# Patient Record
Sex: Female | Born: 2005 | Race: White | Hispanic: No | Marital: Single | Smoking: Never smoker
Health system: Southern US, Community
[De-identification: ages and names within clinical notes are randomized; demographics above are authoritative.]

## PROBLEM LIST (undated history)

## (undated) DIAGNOSIS — R569 Unspecified convulsions: Secondary | ICD-10-CM

## (undated) DIAGNOSIS — G809 Cerebral palsy, unspecified: Secondary | ICD-10-CM

## (undated) DIAGNOSIS — L509 Urticaria, unspecified: Secondary | ICD-10-CM

## (undated) DIAGNOSIS — Q928 Other specified trisomies and partial trisomies of autosomes: Secondary | ICD-10-CM

## (undated) DIAGNOSIS — D649 Anemia, unspecified: Secondary | ICD-10-CM

## (undated) DIAGNOSIS — K219 Gastro-esophageal reflux disease without esophagitis: Secondary | ICD-10-CM

## (undated) DIAGNOSIS — M810 Age-related osteoporosis without current pathological fracture: Secondary | ICD-10-CM

## (undated) HISTORY — DX: Unspecified convulsions: R56.9

## (undated) HISTORY — PX: EYE SURGERY: SHX253

## (undated) HISTORY — PX: NISSEN FUNDOPLICATION: SHX2091

## (undated) HISTORY — PX: HIP SURGERY: SHX245

## (undated) HISTORY — DX: Anemia, unspecified: D64.9

## (undated) HISTORY — PX: TYMPANOTOMY: SHX2588

## (undated) HISTORY — PX: GASTROSTOMY TUBE PLACEMENT: SHX655

## (undated) HISTORY — DX: Urticaria, unspecified: L50.9

---
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
## 2024-01-03 ENCOUNTER — Encounter: Payer: Self-pay | Admitting: Pediatrics

## 2024-01-03 DIAGNOSIS — G800 Spastic quadriplegic cerebral palsy: Secondary | ICD-10-CM

## 2024-01-03 DIAGNOSIS — G40814 Lennox-Gastaut syndrome, intractable, without status epilepticus: Secondary | ICD-10-CM

## 2024-01-03 DIAGNOSIS — R1312 Dysphagia, oropharyngeal phase: Secondary | ICD-10-CM

## 2024-01-04 ENCOUNTER — Telehealth: Payer: Self-pay | Admitting: Pediatrics

## 2024-01-04 NOTE — Telephone Encounter (Signed)
 Fax sent to [facility] for adequate quantity for daily use of real foods blend in variety to manage her nutritional daily requirements  Received SUCCESS, placed in dated 3 folder

## 2024-01-16 ENCOUNTER — Inpatient Hospital Stay (HOSPITAL_COMMUNITY): Admission: RE | Admit: 2024-01-16 | Source: Ambulatory Visit

## 2024-01-16 ENCOUNTER — Other Ambulatory Visit (HOSPITAL_COMMUNITY): Payer: Self-pay | Admitting: Pediatric Nephrology

## 2024-01-16 DIAGNOSIS — I150 Renovascular hypertension: Secondary | ICD-10-CM

## 2024-01-17 ENCOUNTER — Encounter: Attending: Physical Medicine & Rehabilitation | Admitting: Physical Medicine & Rehabilitation

## 2024-01-17 ENCOUNTER — Ambulatory Visit (HOSPITAL_COMMUNITY)
Admission: RE | Admit: 2024-01-17 | Discharge: 2024-01-17 | Disposition: A | Discharge status: Home / Self Care | Source: Ambulatory Visit | Attending: Pediatric Nephrology | Admitting: Pediatric Nephrology

## 2024-01-17 ENCOUNTER — Encounter: Payer: Self-pay | Admitting: Physical Medicine & Rehabilitation

## 2024-01-17 VITALS — BP 128/88 | HR 89

## 2024-01-17 DIAGNOSIS — G800 Spastic quadriplegic cerebral palsy: Secondary | ICD-10-CM | POA: Diagnosis present

## 2024-01-17 DIAGNOSIS — I150 Renovascular hypertension: Secondary | ICD-10-CM | POA: Diagnosis not present

## 2024-01-17 MED ORDER — ONABOTULINUMTOXINA 100 UNITS IJ SOLR
400.0000 [IU] | Freq: Once | INTRAMUSCULAR | Status: AC
  Administered 2024-01-17: 400 [IU] via INTRAMUSCULAR

## 2024-01-17 MED ORDER — SODIUM CHLORIDE (PF) 0.9 % IJ SOLN
8.0000 mL | Freq: Once | INTRAMUSCULAR | Status: AC
  Administered 2024-01-17: 8 mL via INTRAVENOUS

## 2024-01-17 NOTE — Patient Instructions (Signed)

## 2024-01-17 NOTE — Progress Notes (Signed)
 Botox  Injection for spasticity using needle EMG guidance  Dilution: 50 Units/ml Indication: Severe spasticity which interferes with ADL,mobility and/or  hygiene and is unresponsive to medication management and other conservative care Informed consent was obtained after describing risks and benefits of the procedure with the patient. This includes bleeding, bruising, infection, excessive weakness, or medication side effects. A REMS form is on file and signed. Chlorhexidine prep Needle: 27g 1 needle electrode Number of units per muscle RIght  FCR 50 FCU 25 PL 25 OP25 Biceps 50 Brachiorad 25  Left  FCR 50 FCU 25 PL 25 OP 25  Biceps 50 Brachiorad 25  All injections were done after obtaining appropriate EMG activity and after negative drawback for blood. The patient tolerated the procedure well. Post procedure instructions were given. A followup appointment was made.

## 2024-03-04 ENCOUNTER — Encounter: Payer: Self-pay | Admitting: Physical Medicine & Rehabilitation

## 2024-03-04 ENCOUNTER — Encounter: Attending: Physical Medicine & Rehabilitation | Admitting: Physical Medicine & Rehabilitation

## 2024-03-04 VITALS — BP 118/84 | HR 83 | Ht <= 58 in | Wt <= 1120 oz

## 2024-03-04 DIAGNOSIS — M8080XS Other osteoporosis with current pathological fracture, unspecified site, sequela: Secondary | ICD-10-CM | POA: Insufficient documentation

## 2024-03-04 DIAGNOSIS — R569 Unspecified convulsions: Secondary | ICD-10-CM | POA: Diagnosis present

## 2024-03-04 DIAGNOSIS — R252 Cramp and spasm: Secondary | ICD-10-CM | POA: Insufficient documentation

## 2024-03-04 DIAGNOSIS — G800 Spastic quadriplegic cerebral palsy: Secondary | ICD-10-CM | POA: Insufficient documentation

## 2024-03-04 NOTE — Patient Instructions (Signed)
  VISIT SUMMARY: You came in for a follow-up after your recent Botox  treatment for muscle spasticity. You reported improvements in your thumb and wrist mobility and no rash after switching to chlorhexidine as an antiseptic.  YOUR PLAN: SEVERE SPASTICITY: Your muscle spasticity has improved with the increased Botox  dosage and physical therapy. -Continue with the current Botox  regimen, including the increased dosage and targeting of the thumb and wrist flexor muscles. -Keep attending physical therapy sessions to help with stretching and mobility. -Use chlorhexidine as an antiseptic instead of Betadine to avoid rashes.  CONTACT DERMATITIS DUE TO ANTISEPTIC: You have a history of developing rashes with Betadine, but switching to chlorhexidine has prevented this issue. -We have documented Betadine as the cause of your dermatitis. -Continue using chlorhexidine as your antiseptic.                      Contains text generated by Abridge.                                 Contains text generated by Abridge.

## 2024-03-04 NOTE — Progress Notes (Signed)
 Subjective:    Patient ID: Tamara Bond, female    DOB: 09-09-2005, 18 y.o.   MRN: [id number] Will increase total dose from 350 units to 400 units.  We discussed that while it is possible to have a more generalized weakness in the patient of her stature she has been tolerating tube 350 units without any evidence of this Specifically will add opponens pollicis as well as palmaris longus reduce dose to FCU bilaterally RIght  FCR 50 FCU 25 PL 25 OP25 Biceps 50 Brachiorad 25  Left  FCR 50 FCU 25 PL 25 OP 25  Biceps 50 Brachiorad 25 HPI  Discussed the use of AI scribe software for clinical note transcription with the patient, who gave verbal consent to proceed.  History of Present Illness Tamara Bond is a 18 year old female with muscle spasticity who presents for follow-up after Botox  treatment.  She recently received an increased dosage of Botox , from 300 to 400 units, targeting her thumb muscle and a different wrist flexor muscle. She and her mother report that her thumb and wrist feel a little bit better, and she is able to stretch more than before.  She did not experience a rash following the recent treatment, which she attributes to the use of chlorhexidine instead of Betadine. She has a history of developing rashes with Betadine, and her mother is also highly allergic to it.  She is currently undergoing physical therapy, which has contributed to improvements in muscle tightness. Although her muscles remain tight, she can now perform tasks such as cleaning her hands and trimming her nails more easily. She also notices enhanced ability to play with toys and grasp objects, indicating a slight loosening of her muscles.   Pain Inventory Average Pain 0 Pain Right Now 0 My pain is no answer  In the last 24 hours, has pain interfered with the following? General activity No Answer Relation with others No Answer Enjoyment of life No Answer What TIME of day is your pain at  its worst? No Answer Sleep (in general) No Answer  Pain is worse with: No Answer Pain improves with: No Answer Relief from Meds: No Answer  Family History  Problem Relation Age of Onset   Migraines Mother    Asthma Sister    Seizures Maternal Uncle        2 Maternal Uncle's have seizures   Asthma Maternal Grandmother    Migraines Maternal Grandmother    Migraines Maternal Grandfather    Alcohol abuse Paternal Grandmother    Arthritis Neg Hx    Birth defects Neg Hx    Cancer Neg Hx    Varicose Veins Neg Hx    Vision loss Neg Hx    Miscarriages / Stillbirths Neg Hx    Stroke Neg Hx    Mental retardation Neg Hx    Mental illness Neg Hx    Learning disabilities Neg Hx    Kidney disease Neg Hx    Hypertension Neg Hx    Hyperlipidemia Neg Hx    Heart disease Neg Hx    Hearing loss Neg Hx    Early death Neg Hx    Drug abuse Neg Hx    Diabetes Neg Hx    Depression Neg Hx    COPD Neg Hx    Social History   Socioeconomic History   Marital status: Single    Spouse name: Not on file   Number of children: Not on file   Years  of education: Not on file   Highest education level: Not on file  Occupational History   Not on file  Tobacco Use   Smoking status: Never    Passive exposure: Never   Smokeless tobacco: Never  Vaping Use   Vaping status: Never Used  Substance and Sexual Activity   Alcohol use: No    Alcohol/week: 0.0 standard drinks of alcohol   Drug use: No   Sexual activity: Never  Other Topics Concern   Not on file  Social History Narrative   [employer] graduated 2024.    Her maternal grandmother takes care of her during the day while mother works.   Tamara Bond lives with her mother and maternal grandmother and younger sister   PT once a month.      Social Drivers of Corporate Investment Banker Strain: Low Risk (07/29/2020)   Received from [facility]   Overall Financial Resource Strain (CARDIA)    Difficulty of Paying Living Expenses: Not very hard  Food  Insecurity: No Food Insecurity (06/29/2023)   Received from [facility]   Hunger Vital Sign    Within the past 12 months, you worried that your food would run out before you got the money to buy more.: Never true    Within the past 12 months, the food you bought just didn't last and you didn't have money to get more.: Never true  Transportation Needs: No Transportation Needs (07/29/2020)   Received from [facility]   PRAPARE - Transportation    Lack of Transportation (Medical): No    Lack of Transportation (Non-Medical): No  Physical Activity: Not on file  Stress: Not on file  Social Connections: Not on file   Past Surgical History:  Procedure Laterality Date   EYE SURGERY     GASTROSTOMY TUBE PLACEMENT     Nisson   HIP SURGERY     KNEE SURGERY Right 04/14/2022   MYRINGOTOMY WITH TUBE PLACEMENT Bilateral 08/08/2013   Procedure: BILATERAL MYRINGOTOMY WITH TUBE PLACEMENT;  Surgeon: Alm Bouche, MD;  Location: [facility] OR;  Service: ENT;  Laterality: Bilateral;   NISSEN FUNDOPLICATION     SPINE SURGERY  06/25/2017   TENDON RELEASE Bilateral 04/14/2022   TYMPANOTOMY     Past Surgical History:  Procedure Laterality Date   EYE SURGERY     GASTROSTOMY TUBE PLACEMENT     Nisson   HIP SURGERY     KNEE SURGERY Right 04/14/2022   MYRINGOTOMY WITH TUBE PLACEMENT Bilateral 08/08/2013   Procedure: BILATERAL MYRINGOTOMY WITH TUBE PLACEMENT;  Surgeon: Alm Bouche, MD;  Location: [facility] OR;  Service: ENT;  Laterality: Bilateral;   NISSEN FUNDOPLICATION     SPINE SURGERY  06/25/2017   TENDON RELEASE Bilateral 04/14/2022   TYMPANOTOMY     Past Medical History:  Diagnosis Date   Acid reflux    took Prevacid  in the past, no longer treated   Anemia    CP (cerebral palsy) (HCC)    Osteoporosis    Gets IV infusion every 3 months   Seizure (HCC)    Seizures (HCC)    Phreesia 09/29/2019   Trisomy 6    Urticaria    BP 118/84 (Patient Position: Supine)   Pulse 83   Ht 4' 3 (1.295  m)   Wt 60 lb (27.2 kg)   SpO2 92%   BMI 16.22 kg/m   Opioid Risk Score:   Fall Risk Score:  `1  Depression screen [facility] 2/9  No data to display            Review of Systems  All other systems reviewed and are negative.      Objective:   Physical Exam General No acute distress, nonverbal, wheelchair-bound Tone MAS  Right wrist flexor 4 Left wrist flexor 4  Left opponens pollicis 3 Right opponens pollicis 3  Right finger flexors 2 Left finger flexors 2  Right elbow flexors 4 Left elbow flexors 4  Unable to do manual muscle testing since patient is unable to cooperate with examination.       Assessment & Plan:   Assessment and Plan Assessment & Plan Severe spasticity Improved with increased Botox  dosage and inclusion of thumb and wrist flexor muscles. Enhanced thumb and wrist mobility noted. - Continue current Botox  regimen with increased dosage and inclusion of thumb and wrist flexor muscles. - Continue physical therapy for stretching and mobility.   Contact dermatitis due to antiseptic, resolved Contact dermatitis likely due to Betadine, improved with chlorhexidine. No rash with chlorhexidine. - Documented Betadine as cause of dermatitis with low severity. - Continue using chlorhexidine as antiseptic.

## 2024-03-07 ENCOUNTER — Encounter: Admitting: Physical Medicine and Rehabilitation

## 2024-03-07 ENCOUNTER — Encounter: Payer: Self-pay | Admitting: Physical Medicine and Rehabilitation

## 2024-03-07 VITALS — BP 130/80 | HR 145 | Ht <= 58 in | Wt <= 1120 oz

## 2024-03-07 DIAGNOSIS — G800 Spastic quadriplegic cerebral palsy: Secondary | ICD-10-CM | POA: Diagnosis not present

## 2024-03-07 DIAGNOSIS — R569 Unspecified convulsions: Secondary | ICD-10-CM | POA: Diagnosis not present

## 2024-03-07 DIAGNOSIS — M8080XS Other osteoporosis with current pathological fracture, unspecified site, sequela: Secondary | ICD-10-CM | POA: Diagnosis not present

## 2024-03-07 DIAGNOSIS — R252 Cramp and spasm: Secondary | ICD-10-CM

## 2024-03-07 NOTE — Patient Instructions (Signed)
 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   Needs new roll in shower chair with head support and sling- current one slings keep getting mildewed- wondering if can change brands? Hers is currently >67 years old maybe even 72+ years old.  Per d/w Penne at [employer], have started a quote on it.   2. Needs new w/c assessment- with mods and new custom seat and custom back. Not sure if has new Rx.  Gets some form Dr Waddell-  Pt is getting large stage I on L buttock- 3.5 to 5 inches in diameter- - almost entire L buttock due to current seating- w/c base is OK, but needs new custom seating and back due to skin issues- her last was 18 years old-  probably has had changes in spine since fusion causing skin issues.  Patient meets criteria due to her Quadriplegic CP and issues as stated above. .     3.   We discussed the standing in stander- came from [employer]- will need  foot and knee xrays-  can do spine xrays as well just to make sure no changes. If Dr Waddell still wants her to get into stander, let me know, knowing it doesn't improve osteoporosis.    4. Hx of R femur pathological fx of R femur due to osteoporosis- can make standing more difficult.   5. Standing in stander helps spasticity, and bowels- but will NOT help osteoporosis- and has already had osteoporotic pathological fx- which concerns me with the standing.  Will have mother call me/Dr Waddell and see what Dr Waddell think with this new information and go from there- don't need xrays unless going to stand her. .    6.   F/U in 3 months double appt- CP.   7. Needs to get pressure relief in w/c, so I understand 6 hours is very reasonable in w/c daily.

## 2024-03-07 NOTE — Progress Notes (Signed)
 Subjective:    Patient ID: Tamara Bond, female    DOB: July 23, 2005, 18 y.o.   MRN: [id number]  HPI  Patient is a 18 yr old female with  quadriplegic Cerebral palsy and spasticity as well as    If sits up more than 6 hours  A spot will show up on her L buttock that gets red- - almost always there- and then gets more red-not sure if blanchable-  But doesn't show signs of being broken down- 3-4 inches in diameter.    [employer]- Penne- w/c is 18 years old or so, but needs padding changed up-   Bath chair for  Mildew on sling-  Roll in bath chair-    Issues with not being to stand- it's old- not sure if will still fit in it.  Jon is hesitant- because knee caps still mainly pop out on the Right side- when putting shoes on, etc.  Surgery in past - for  popping out Patellae- at least a few years ago.  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    Hx of  hip surgery- B/L-  hip was coming out of socket. 1 side was more than coming out-  Has AFO's- they fit- just got not long ago.    All surgeries have been done at [facility].    Does bone infusions for osteoporosis- goes next on 12/12-  has been getting those for years- just restarted after a break.  Normalized when got infusions before.    Did start period, but not til 17.   Botox - by Dr Carilyn- doing only in Ue's- elbows to hands. More relaxed.  Used ot get in hamstrings, but not lately- should focus on Ue's more right now.    Pees pretty well- was holding urine/cathing needed years ago, but not lately  Never had reason - was told not neurogenic bladder- did urodynamics- has been voiding incontinently last few years.    Pain Inventory Average Pain no answer Pain Right Now No answer My pain is No answer  In the last 24 hours, has pain interfered with the following? General activity No answer Relation with others No answer Enjoyment of life no answer What TIME of day is your pain at its worst? varies Sleep (in  general) NA  Pain is worse with: No answer Pain improves with: injections Relief from Meds: 5  Family History  Problem Relation Age of Onset   Migraines Mother    Asthma Sister    Seizures Maternal Uncle        2 Maternal Uncle's have seizures   Asthma Maternal Grandmother    Migraines Maternal Grandmother    Migraines Maternal Grandfather    Alcohol abuse Paternal Grandmother    Arthritis Neg Hx    Birth defects Neg Hx    Cancer Neg Hx    Varicose Veins Neg Hx    Vision loss Neg Hx    Miscarriages / Stillbirths Neg Hx    Stroke Neg Hx    Mental retardation Neg Hx    Mental illness Neg Hx    Learning disabilities Neg Hx    Kidney disease Neg Hx    Hypertension Neg Hx    Hyperlipidemia Neg Hx    Heart disease Neg Hx    Hearing loss Neg Hx    Early death Neg Hx    Drug abuse Neg Hx    Diabetes Neg Hx    Depression Neg Hx    COPD Neg Hx  Social History   Socioeconomic History   Marital status: Single    Spouse name: Not on file   Number of children: Not on file   Years of education: Not on file   Highest education level: Not on file  Occupational History   Not on file  Tobacco Use   Smoking status: Never    Passive exposure: Never   Smokeless tobacco: Never  Vaping Use   Vaping status: Never Used  Substance and Sexual Activity   Alcohol use: No    Alcohol/week: 0.0 standard drinks of alcohol   Drug use: No   Sexual activity: Never  Other Topics Concern   Not on file  Social History Narrative   Tamara Bond graduated 2024.    Her maternal grandmother takes care of her during the day while mother works.   Tamara Bond lives with her mother and maternal grandmother and younger sister   PT once a month.      Social Drivers of Corporate Investment Banker Strain: Low Risk (07/29/2020)   Received from [facility]   Overall Financial Resource Strain (CARDIA)    Difficulty of Paying Living Expenses: Not very hard  Food Insecurity: No Food Insecurity (06/29/2023)    Received from [facility]   Hunger Vital Sign    Within the past 12 months, you worried that your food would run out before you got the money to buy more.: Never true    Within the past 12 months, the food you bought just didn't last and you didn't have money to get more.: Never true  Transportation Needs: No Transportation Needs (07/29/2020)   Received from [facility]   PRAPARE - Transportation    Lack of Transportation (Medical): No    Lack of Transportation (Non-Medical): No  Physical Activity: Not on file  Stress: Not on file  Social Connections: Not on file   Past Surgical History:  Procedure Laterality Date   EYE SURGERY     GASTROSTOMY TUBE PLACEMENT     [place]isson   HIP SURGERY     KNEE SURGERY Right 04/14/2022   MYRINGOTOMY WITH TUBE PLACEMENT Bilateral 08/08/2013   Procedure: BILATERAL MYRINGOTOMY WITH TUBE PLACEMENT;  Surgeon: Alm Bouche, MD;  Location: [facility] OR;  Service: ENT;  Laterality: Bilateral;   NISSEN FUNDOPLICATION     SPINE SURGERY  06/25/2017   TENDON RELEASE Bilateral 04/14/2022   TYMPANOTOMY     Past Surgical History:  Procedure Laterality Date   EYE SURGERY     GASTROSTOMY TUBE PLACEMENT     [place]   HIP SURGERY     KNEE SURGERY Right 04/14/2022   MYRINGOTOMY WITH TUBE PLACEMENT Bilateral 08/08/2013   Procedure: BILATERAL MYRINGOTOMY WITH TUBE PLACEMENT;  Surgeon: Alm Bouche, MD;  Location: [facility] OR;  Service: ENT;  Laterality: Bilateral;   NISSEN FUNDOPLICATION     SPINE SURGERY  06/25/2017   TENDON RELEASE Bilateral 04/14/2022   TYMPANOTOMY     Past Medical History:  Diagnosis Date   Acid reflux    took Prevacid  in the past, no longer treated   Anemia    CP (cerebral palsy) (HCC)    Osteoporosis    Gets IV infusion every 3 months   Seizure (HCC)    Seizures (HCC)    Phreesia 09/29/2019   Trisomy 6    Urticaria    BP 130/80 (Cuff Size: Small)   Pulse (!) 145   Ht 4' 3 (1.295 m)   Wt 60  lb (27.2 kg)   SpO2 96%   BMI 16.22  kg/m   Opioid Risk Score:   Fall Risk Score:  `1  Depression screen PHQ 2/9      No data to display           Review of Systems  Neurological:        Spasticity  All other systems reviewed and are negative.      Objective:   Physical Exam  Awake, some UE movement actively, but nonverbal-  Keeps wrists very hyper flexed- almost 80 degrees; thumbs tucked in;   in manual w/c with custom back and seat; accompanied by mother, NAD Can get wrists- ~ 30 degrees, but cannot get to neutral- lacking 40-5 degrees of neutral Can extend R elbow 100% but lacking 10-15 degrees on L MAS of 3 in elbows and MAS of 4 in thumbs and wrists  L ankle 95 degrees ankle DF and R- 85 degrees ankle DF Has looser patella on R- L is somewhat tight/held in place 5-6 beats clonus on R ankle and almost loose on L ankle-  MAS of 2 at ankles on R 1 on L ; and lacking 25-30 degrees R knee extension and 15 degrees on L knee Hips hard to assess since in w/c custom seating makes it difficult to check, but appears MAS of 2-3.  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   Needs new roll in shower chair with head support and sling- current one slings keep getting mildewed- wondering if can change brands? Hers is currently >85 years old maybe even 71+ years old.  Per d/w Penne at [employer], have started a quote on it.   2. Needs new w/c assessment- with mods and new custom seat and custom back. Not sure if has new Rx.  Gets some form Dr Waddell-  Pt is getting large stage I on L buttock- 3.5 to 5 inches in diameter- - almost entire L buttock due to current seating- w/c base is OK, but needs new custom seating and back due to skin  issues- her last was 18 years old-  probably has had changes in spine since fusion causing skin issues.  Patient meets criteria due to her Quadriplegic CP and issues as stated above. .     3.   We discussed the standing in stander- came from [employer]- will need  foot and knee xrays-  can do spine xrays as well just to make sure no changes. If Dr Waddell still wants her to get into stander, let me know, knowing it doesn't improve osteoporosis.    4. Hx of R femur pathological fx of R femur due to osteoporosis- can make standing more difficult.   5. Standing in stander helps spasticity, and bowels- but will NOT help osteoporosis- and has already had osteoporotic pathological fx- which concerns me with the standing.  Will have mother call me/Dr Waddell and see what Dr Waddell think with this new information and go from there- don't need xrays unless going to stand her. .    6.   F/U in 3 months double appt- CP.   7. Needs to get pressure relief in  w/c, so I understand 6 hours is very reasonable in w/c daily.    I spent a total of  42  minutes on total care today- >50% coordination of care- due to d/w pt's mother as detailed above, reviewing chart while in room with pt- and d/w family about pressure ulcer risk.

## 2024-04-22 ENCOUNTER — Encounter: Attending: Physical Medicine & Rehabilitation | Admitting: Physical Medicine & Rehabilitation

## 2024-04-22 ENCOUNTER — Encounter: Payer: Self-pay | Admitting: Physical Medicine & Rehabilitation

## 2024-04-22 VITALS — BP 134/90 | HR 115 | Ht <= 58 in

## 2024-04-22 DIAGNOSIS — G800 Spastic quadriplegic cerebral palsy: Secondary | ICD-10-CM | POA: Insufficient documentation

## 2024-04-22 MED ORDER — ONABOTULINUMTOXINA 100 UNITS IJ SOLR
400.0000 [IU] | Freq: Once | INTRAMUSCULAR | Status: AC
  Administered 2024-04-22: 400 [IU] via INTRAMUSCULAR

## 2024-04-22 MED ORDER — SODIUM CHLORIDE (PF) 0.9 % IJ SOLN
8.0000 mL | Freq: Once | INTRAMUSCULAR | Status: AC
  Administered 2024-04-22: 8 mL

## 2024-04-22 NOTE — Progress Notes (Signed)
 Botox  Injection for spasticity using needle EMG guidance  Dilution: 50 Units/ml Indication: Severe spasticity which interferes with ADL,mobility and/or  hygiene and is unresponsive to medication management and other conservative care Informed consent was obtained after describing risks and benefits of the procedure with the patient. This includes bleeding, bruising, infection, excessive weakness, or medication side effects. A REMS form is on file and signed. Chlorhexidine prep Needle: 27g 1 needle electrode Number of units per muscle RIght  FCR 50 FCU 25 PL 25 OP25 Biceps 50 Brachiorad 25  Left  FCR 50 FCU 25 PL 25 OP 25  Biceps 50 Brachiorad 25  All injections were done after obtaining appropriate EMG activity and after negative drawback for blood. The patient tolerated the procedure well. Post procedure instructions were given. A followup appointment was made.

## 2024-04-22 NOTE — Patient Instructions (Signed)

## 2024-05-12 ENCOUNTER — Ambulatory Visit (INDEPENDENT_AMBULATORY_CARE_PROVIDER_SITE_OTHER): Admitting: Pediatrics

## 2024-05-19 ENCOUNTER — Ambulatory Visit (INDEPENDENT_AMBULATORY_CARE_PROVIDER_SITE_OTHER): Admitting: Family

## 2024-06-06 ENCOUNTER — Encounter: Admitting: Physical Medicine and Rehabilitation

## 2024-07-22 ENCOUNTER — Encounter: Attending: Physical Medicine & Rehabilitation | Admitting: Physical Medicine & Rehabilitation
